# Patient Record
Sex: Male | Born: 1975 | Race: Black or African American | Hispanic: No | Marital: Married | Smoking: Never smoker
Health system: Southern US, Community
[De-identification: ages and names within clinical notes are randomized; demographics above are authoritative.]

## PROBLEM LIST (undated history)

## (undated) DIAGNOSIS — E7800 Pure hypercholesterolemia, unspecified: Secondary | ICD-10-CM

---

## 2007-03-11 ENCOUNTER — Emergency Department (HOSPITAL_COMMUNITY): Admission: EM | Admit: 2007-03-11 | Discharge: 2007-03-11 | Payer: Self-pay | Admitting: Emergency Medicine

## 2014-05-27 ENCOUNTER — Ambulatory Visit
Admission: RE | Admit: 2014-05-27 | Discharge: 2014-05-27 | Disposition: A | Discharge status: Home / Self Care | Payer: BC Managed Care – PPO | Source: Ambulatory Visit | Attending: Geriatric Medicine | Admitting: Geriatric Medicine

## 2014-05-27 ENCOUNTER — Other Ambulatory Visit: Payer: Self-pay | Admitting: Geriatric Medicine

## 2014-05-27 DIAGNOSIS — M546 Pain in thoracic spine: Secondary | ICD-10-CM

## 2015-11-29 IMAGING — CR DG THORACIC SPINE 3V
3 series · 3 of 3 positions shown · non-contrast
Comparison: None

CLINICAL DATA: Mid thoracic spine pain for 3 weeks, no injury

EXAM:
THORACIC SPINE - 2 VIEW + SWIMMERS

[t t-spine a.p.]
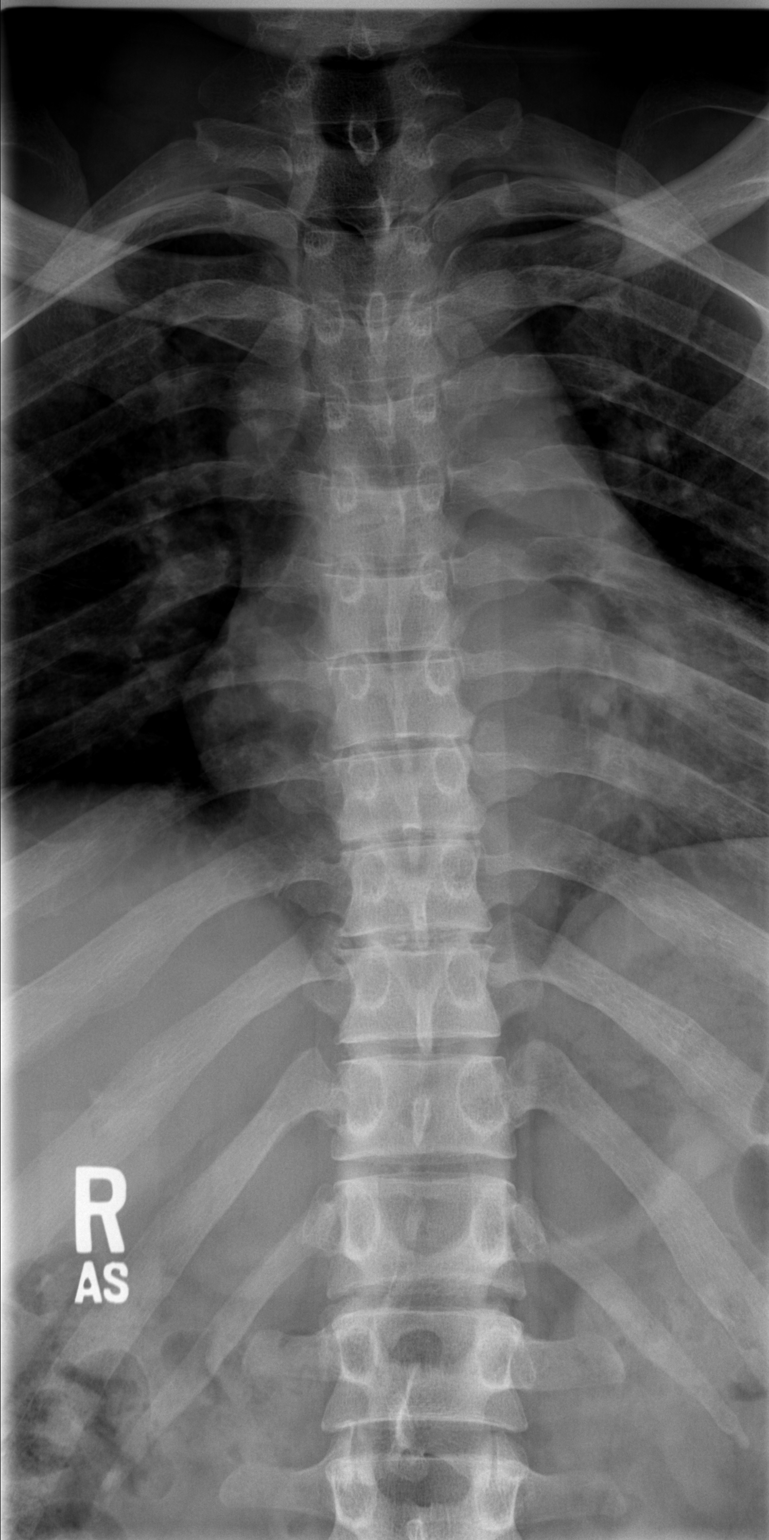

[t t-spine lat *]
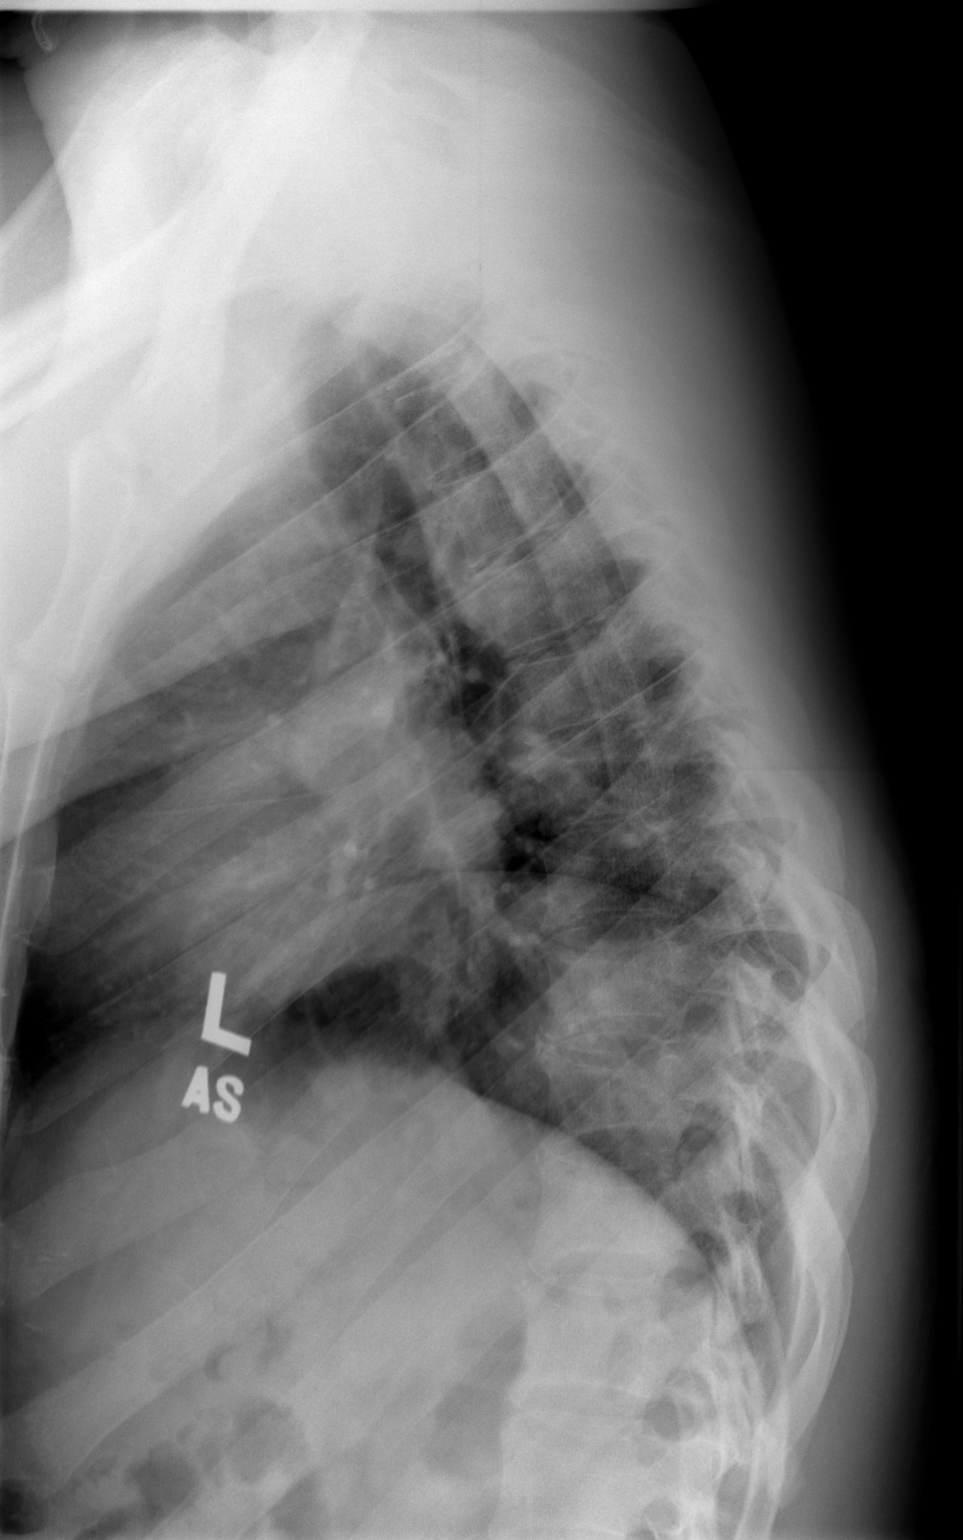

[t swimmers]
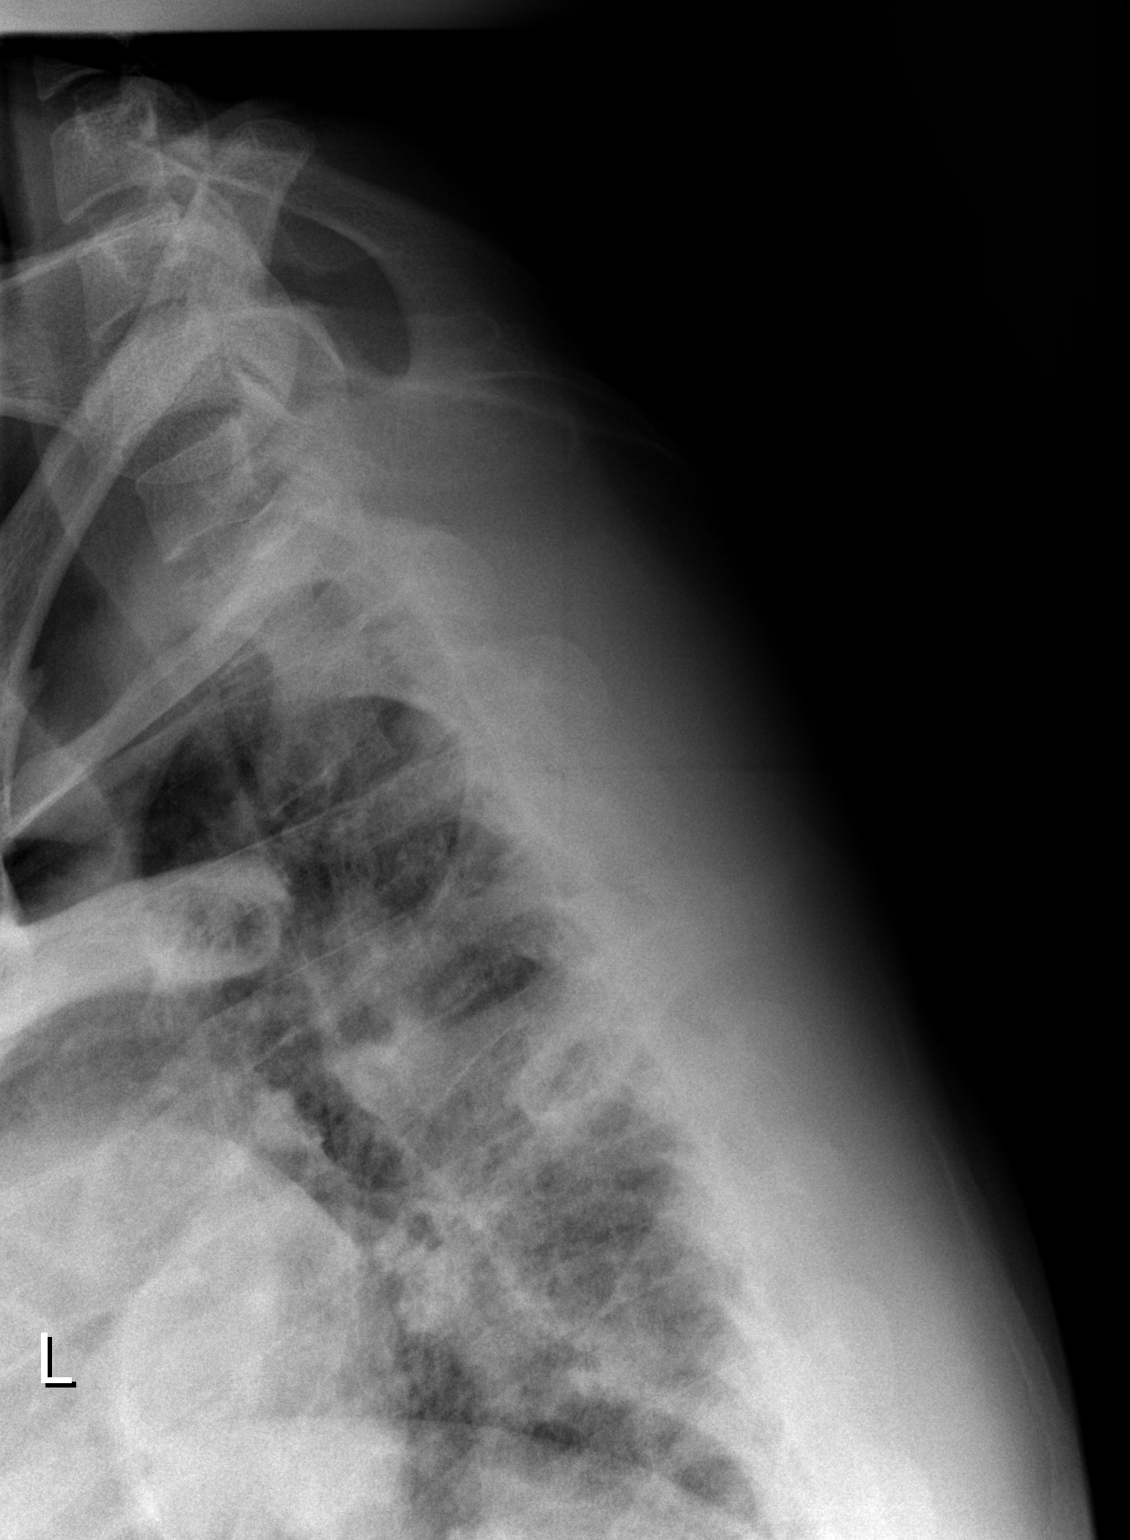

[3 of 3 positions shown; findings below may reference images not displayed]

FINDINGS: Twelve pairs of ribs.

Minimal levoconvex thoracolumbar scoliosis.

Osseous mineralization normal.

Vertebral body and disc space heights maintained.

No acute fracture, subluxation or bone destruction.

Visualized posterior ribs unremarkable.
IMPRESSION: No acute thoracic spine abnormalities.

## 2016-05-25 DIAGNOSIS — Z23 Encounter for immunization: Secondary | ICD-10-CM | POA: Diagnosis not present

## 2016-09-15 DIAGNOSIS — Z23 Encounter for immunization: Secondary | ICD-10-CM | POA: Diagnosis not present

## 2016-09-15 DIAGNOSIS — Z0000 Encounter for general adult medical examination without abnormal findings: Secondary | ICD-10-CM | POA: Diagnosis not present

## 2016-09-15 DIAGNOSIS — Z125 Encounter for screening for malignant neoplasm of prostate: Secondary | ICD-10-CM | POA: Diagnosis not present

## 2016-12-03 ENCOUNTER — Emergency Department
Admission: EM | Admit: 2016-12-03 | Discharge: 2016-12-03 | Disposition: A | Discharge status: Home / Self Care | Payer: BLUE CROSS/BLUE SHIELD | Source: Home / Self Care | Attending: Family Medicine | Admitting: Family Medicine

## 2016-12-03 ENCOUNTER — Encounter: Payer: Self-pay | Admitting: Emergency Medicine

## 2016-12-03 DIAGNOSIS — H6692 Otitis media, unspecified, left ear: Secondary | ICD-10-CM

## 2016-12-03 DIAGNOSIS — J069 Acute upper respiratory infection, unspecified: Secondary | ICD-10-CM

## 2016-12-03 HISTORY — DX: Pure hypercholesterolemia, unspecified: E78.00

## 2016-12-03 MED ORDER — AMOXICILLIN-POT CLAVULANATE 875-125 MG PO TABS: 1.0000 | ORAL_TABLET | Freq: Two times a day (BID) | ORAL | 0 refills | Status: DC

## 2016-12-03 MED ORDER — BENZONATATE 100 MG PO CAPS: 100.0000 mg | ORAL_CAPSULE | Freq: Three times a day (TID) | ORAL | 0 refills | Status: DC

## 2016-12-03 MED ORDER — FLUTICASONE PROPIONATE 50 MCG/ACT NA SUSP: 2.0000 | Freq: Every day | NASAL | 2 refills | Status: DC

## 2016-12-03 NOTE — ED Provider Notes (Signed)
CSN: [id number]     Arrival date & time 12/03/16  0913 History   First MD Initiated Contact with Patient 12/03/16 [phone]     Chief Complaint  Patient presents with  . Facial Pain   (Consider location/radiation/quality/duration/timing/severity/associated sxs/prior Treatment) HPI  Scott Clayton is a 41 y.o. male presenting to UC with c/o 2-3 days of sinus congestion with facial pain and pressure as well as Left ear pain that is worse at night, aching and throbbing.  Mild to moderately intermittent productive cough.  He took mucinex the first day but no OTC medications since then. Denies fever, chills, n/v/d. Denies known sick contacts.    Past Medical History:  Diagnosis Date  . Hypercholesteremia    History reviewed. No pertinent surgical history. Family History  Problem Relation Age of Onset  . Renal Disease Mother   . Hypertension Father    Social History  Substance Use Topics  . Smoking status: Never Smoker  . Smokeless tobacco: Never Used  . Alcohol use No    Review of Systems  Constitutional: Negative for chills and fever.  HENT: Positive for congestion, ear pain, postnasal drip, rhinorrhea, sinus pain, sinus pressure and sore throat. Negative for trouble swallowing and voice change.   Respiratory: Positive for cough. Negative for shortness of breath.   Cardiovascular: Negative for chest pain and palpitations.  Gastrointestinal: Negative for abdominal pain, diarrhea, nausea and vomiting.  Musculoskeletal: Negative for arthralgias, back pain and myalgias.  Skin: Negative for rash.  Neurological: Positive for headaches (frontal). Negative for dizziness and light-headedness.    Allergies  Sulfa antibiotics  Home Medications   Prior to Admission medications   Medication Sig Start Date End Date Taking? Authorizing Provider  amoxicillin-clavulanate (AUGMENTIN) 875-125 MG tablet Take 1 tablet by mouth 2 (two) times daily. One po bid x 7 days 12/03/16   Junius Finner'Malley, Tajae Rybicki, PA-C   benzonatate (TESSALON) 100 MG capsule Take 1-2 capsules (100-200 mg total) by mouth every 8 (eight) hours. 12/03/16   Junius Finner'Malley, Bostyn Bogie, PA-C  fluticasone (FLONASE) 50 MCG/ACT nasal spray Place 2 sprays into both nostrils daily. 12/03/16   Junius Finner'Malley, Gilma Bessette, PA-C   Meds Ordered and Administered this Visit  Medications - No data to display  BP 118/74 (BP Location: Left Arm)   Pulse (!) 48   Temp 98.6 F (37 C) (Oral)   Ht 5' 11.5" (1.816 m)   Wt 204 lb 12 oz (92.9 kg)   SpO2 97%   BMI 28.16 kg/m  No data found.   Physical Exam  Constitutional: He appears well-developed and well-nourished. No distress.  HENT:  Head: Normocephalic and atraumatic.  Right Ear: Tympanic membrane is erythematous.  Left Ear: Tympanic membrane is erythematous and bulging.  Nose: Nose normal.  Mouth/Throat: Uvula is midline, oropharynx is clear and moist and mucous membranes are normal.  Eyes: Conjunctivae are normal. No scleral icterus.  Neck: Normal range of motion. Neck supple.  Cardiovascular: Normal rate, regular rhythm and normal heart sounds.   Pulmonary/Chest: Effort normal and breath sounds normal. No stridor. No respiratory distress. He has no wheezes. He has no rales.  Abdominal: Soft. He exhibits no distension. There is no tenderness.  Musculoskeletal: Normal range of motion.  Lymphadenopathy:    He has no cervical adenopathy.  Neurological: He is alert.  Skin: Skin is warm and dry. He is not diaphoretic.  Nursing note and vitals reviewed.   Urgent Care Course     Procedures (including critical care time)  Labs Review  Labs Reviewed - No data to display  Imaging Review No results found.   MDM   1. Left acute otitis media   2. Upper respiratory tract infection, unspecified type    Pt c/o 2-3 days of URI symptoms. TMs erythematous with Left TM beginning to bulge.  Will treat for Left AOM Rx; Augmentin, Flonase and tessalon  f/u with PCP as needed.    Junius Finner,  PA-C 12/03/16 1022

## 2016-12-03 NOTE — ED Triage Notes (Signed)
Pt c/o possible sinus infection, sore throat x 2-3 days, drainage from eyes, left ear pain x 1 day.

## 2017-02-22 DIAGNOSIS — Z0000 Encounter for general adult medical examination without abnormal findings: Secondary | ICD-10-CM | POA: Diagnosis not present

## 2017-09-01 DIAGNOSIS — Z23 Encounter for immunization: Secondary | ICD-10-CM | POA: Diagnosis not present

## 2018-06-01 DIAGNOSIS — Z23 Encounter for immunization: Secondary | ICD-10-CM | POA: Diagnosis not present

## 2018-07-13 ENCOUNTER — Other Ambulatory Visit: Payer: Self-pay | Admitting: Internal Medicine

## 2018-07-13 ENCOUNTER — Ambulatory Visit
Admission: RE | Admit: 2018-07-13 | Discharge: 2018-07-13 | Disposition: A | Discharge status: Home / Self Care | Payer: BLUE CROSS/BLUE SHIELD | Source: Ambulatory Visit | Attending: Internal Medicine | Admitting: Internal Medicine

## 2018-07-13 DIAGNOSIS — R059 Cough, unspecified: Secondary | ICD-10-CM

## 2018-07-13 DIAGNOSIS — R05 Cough: Secondary | ICD-10-CM | POA: Diagnosis not present

## 2018-08-10 DIAGNOSIS — Z8701 Personal history of pneumonia (recurrent): Secondary | ICD-10-CM | POA: Diagnosis not present

## 2018-09-06 DIAGNOSIS — J302 Other seasonal allergic rhinitis: Secondary | ICD-10-CM | POA: Diagnosis not present

## 2018-09-06 DIAGNOSIS — Z13220 Encounter for screening for lipoid disorders: Secondary | ICD-10-CM | POA: Diagnosis not present

## 2018-09-06 DIAGNOSIS — Z8249 Family history of ischemic heart disease and other diseases of the circulatory system: Secondary | ICD-10-CM | POA: Diagnosis not present

## 2018-09-06 DIAGNOSIS — Z0000 Encounter for general adult medical examination without abnormal findings: Secondary | ICD-10-CM | POA: Diagnosis not present

## 2018-09-06 DIAGNOSIS — Z125 Encounter for screening for malignant neoplasm of prostate: Secondary | ICD-10-CM | POA: Diagnosis not present

## 2019-05-10 DIAGNOSIS — M722 Plantar fascial fibromatosis: Secondary | ICD-10-CM | POA: Diagnosis not present

## 2019-05-25 DIAGNOSIS — Z23 Encounter for immunization: Secondary | ICD-10-CM | POA: Diagnosis not present

## 2019-09-26 DIAGNOSIS — Z0000 Encounter for general adult medical examination without abnormal findings: Secondary | ICD-10-CM | POA: Diagnosis not present

## 2019-09-26 DIAGNOSIS — Z125 Encounter for screening for malignant neoplasm of prostate: Secondary | ICD-10-CM | POA: Diagnosis not present

## 2019-09-26 DIAGNOSIS — E7800 Pure hypercholesterolemia, unspecified: Secondary | ICD-10-CM | POA: Diagnosis not present

## 2020-01-11 DIAGNOSIS — F419 Anxiety disorder, unspecified: Secondary | ICD-10-CM | POA: Diagnosis not present

## 2020-01-15 IMAGING — DX DG CHEST 2V
2 series · 2 of 2 positions shown · non-contrast
Comparison: Chest x-ray 05/27/2014.

CLINICAL DATA: Dry cough.  Shortness of breath.

EXAM:
CHEST - 2 VIEW

[dg chest 2 view (1 of 2)]
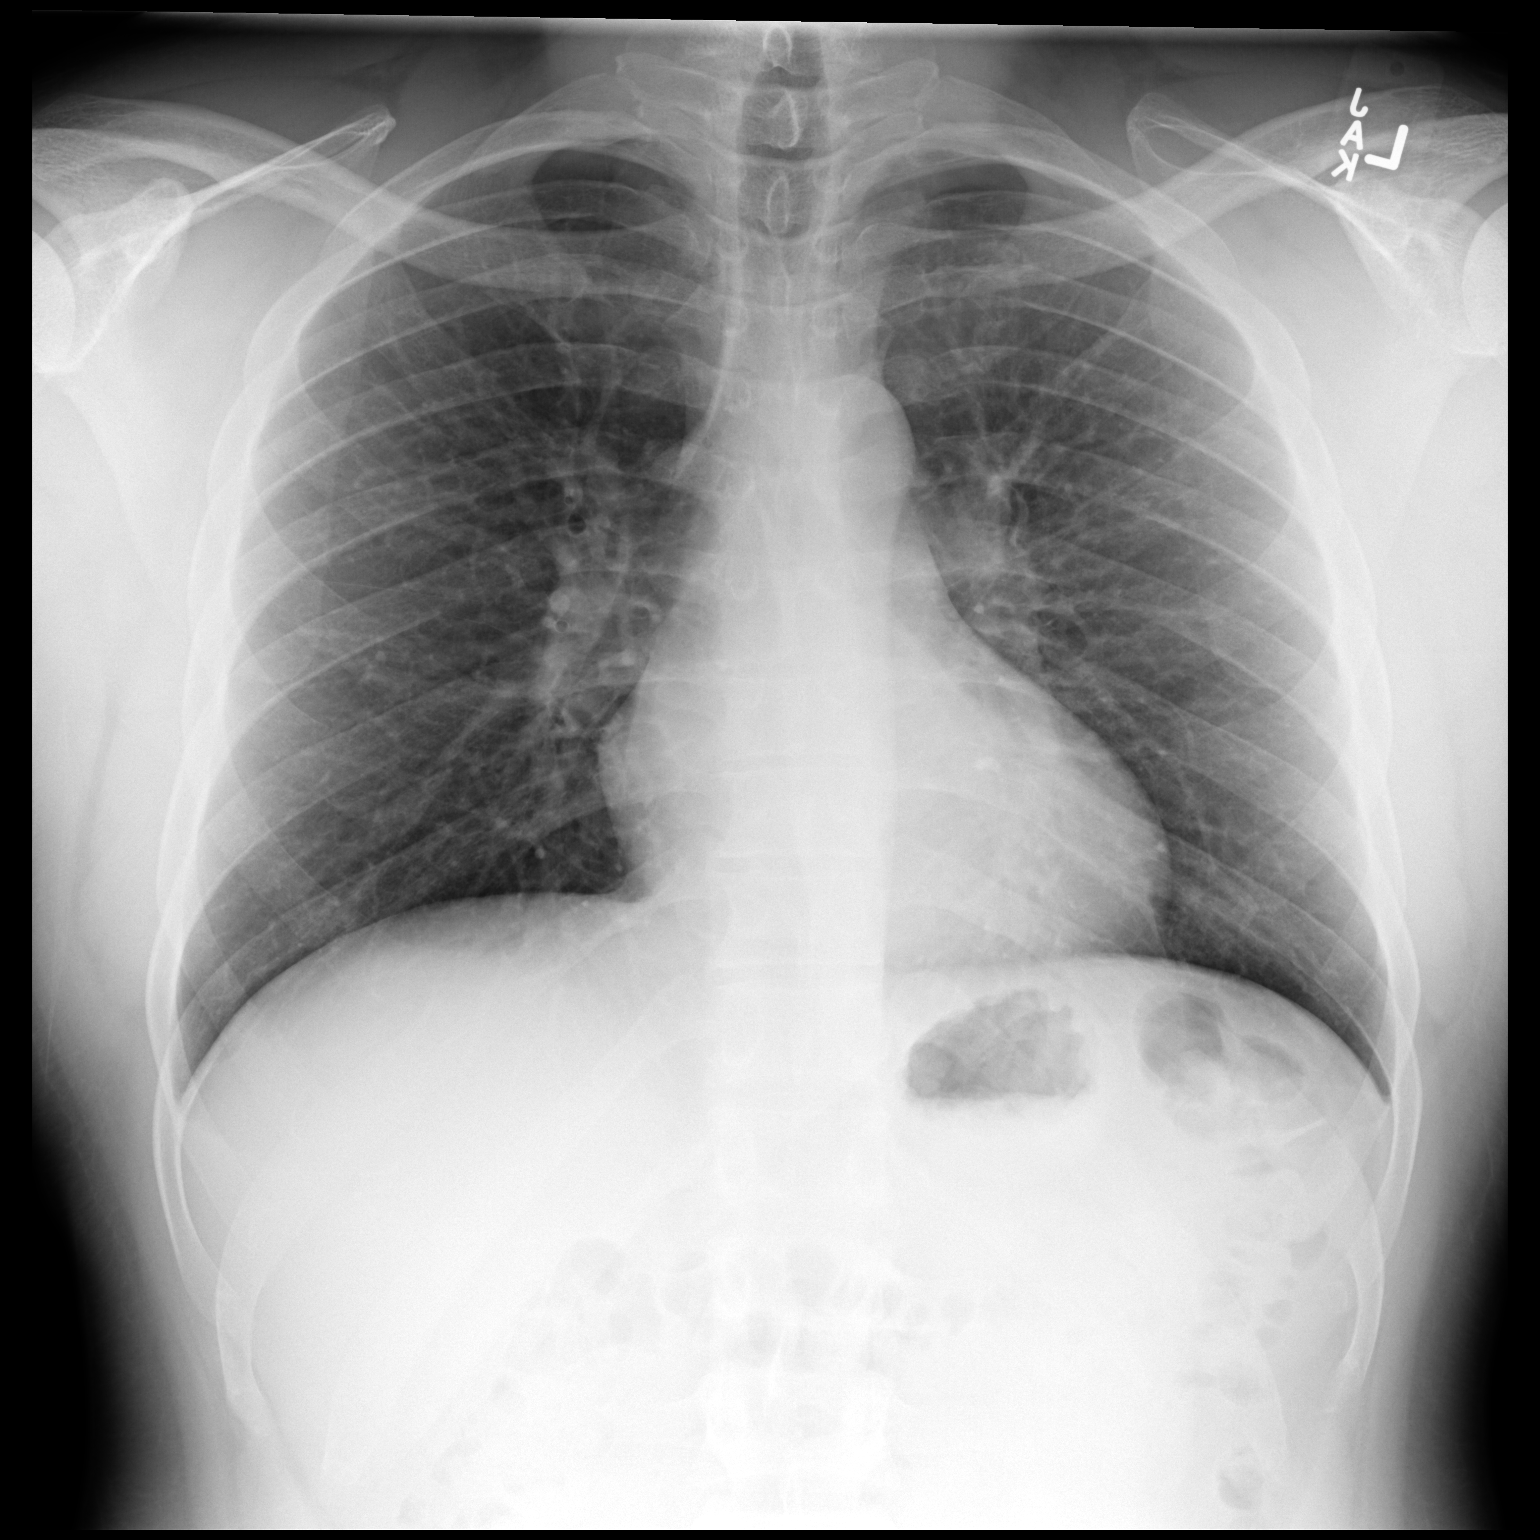

[dg chest 2 view (2 of 2)]
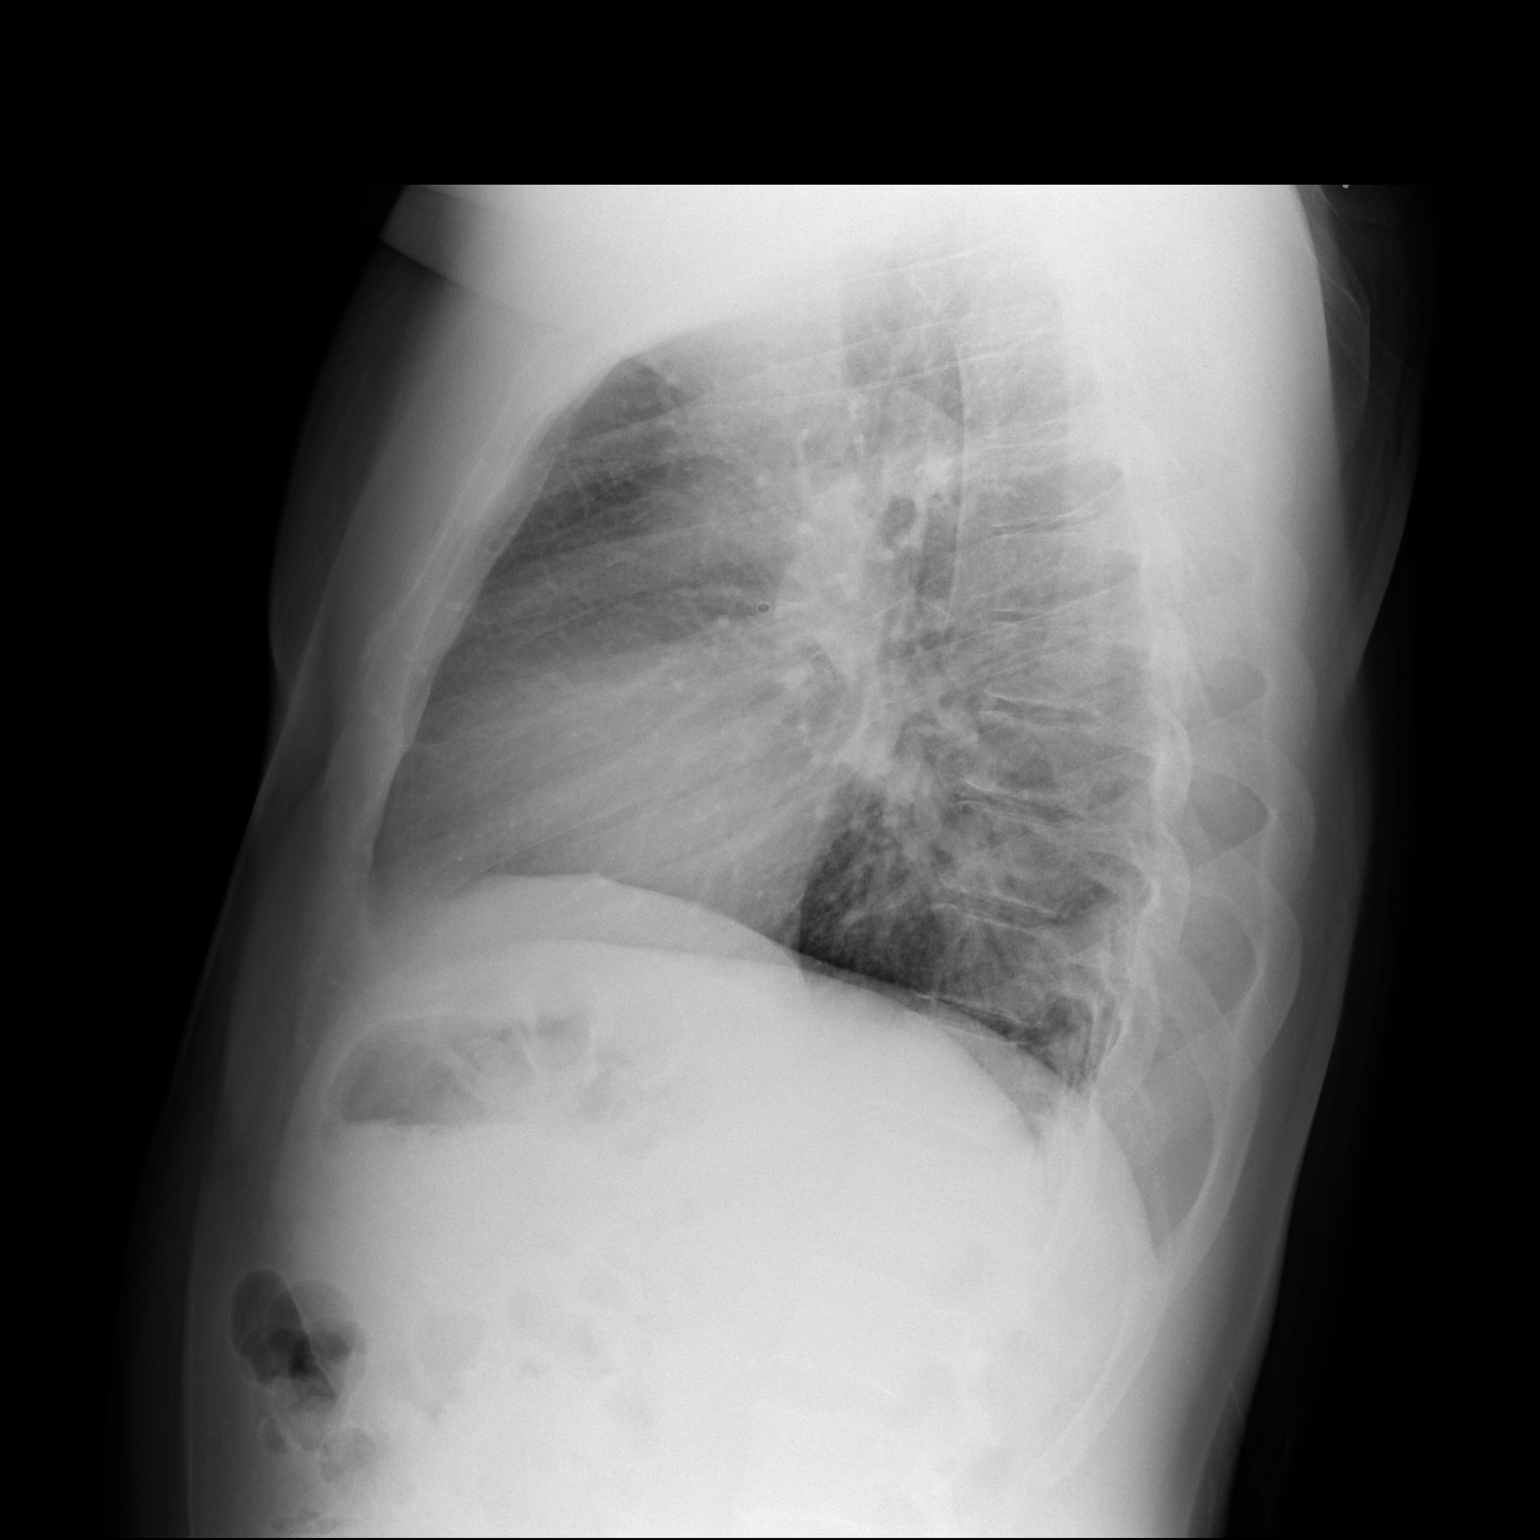

[2 of 2 positions shown; findings below may reference images not displayed]

FINDINGS: Mediastinum and hilar structures normal. Heart size normal. Mild
left base infiltrate. Pleural effusion or pneumothorax. No acute
bony abnormality.
IMPRESSION: Mild left base infiltrate suggesting pneumonia.

## 2020-07-07 ENCOUNTER — Ambulatory Visit: Payer: Self-pay | Attending: Internal Medicine

## 2020-07-07 DIAGNOSIS — Z23 Encounter for immunization: Secondary | ICD-10-CM

## 2020-07-07 NOTE — Progress Notes (Signed)
   Covid-19 Vaccination Clinic  Name:  Scott Clayton    MRN: [id number] DOB: 09-Dec-1975  07/07/2020  Mr. Scott Clayton was observed post Covid-19 immunization for 15 minutes without incident. He was provided with Vaccine Information Sheet and instruction to access the V-Safe system.   Mr. Scott Clayton was instructed to call 911 with any severe reactions post vaccine: Marland Kitchen Difficulty breathing  . Swelling of face and throat  . A fast heartbeat  . A bad rash all over body  . Dizziness and weakness   Immunizations Administered    Name Date Dose VIS Date Route   Pfizer COVID-19 Vaccine 07/07/2020  4:40 PM 0.3 mL 05/20/2020 Intramuscular   Manufacturer: [employer], [employer]   Lot: [id number]   NDC: [address]-5

## 2021-04-29 DIAGNOSIS — Z13220 Encounter for screening for lipoid disorders: Secondary | ICD-10-CM | POA: Diagnosis not present

## 2021-04-29 DIAGNOSIS — Z0000 Encounter for general adult medical examination without abnormal findings: Secondary | ICD-10-CM | POA: Diagnosis not present

## 2021-04-29 DIAGNOSIS — Z23 Encounter for immunization: Secondary | ICD-10-CM | POA: Diagnosis not present

## 2021-04-29 DIAGNOSIS — E7800 Pure hypercholesterolemia, unspecified: Secondary | ICD-10-CM | POA: Diagnosis not present

## 2021-11-12 DIAGNOSIS — K649 Unspecified hemorrhoids: Secondary | ICD-10-CM | POA: Diagnosis not present

## 2021-11-12 DIAGNOSIS — Z1211 Encounter for screening for malignant neoplasm of colon: Secondary | ICD-10-CM | POA: Diagnosis not present

## 2022-05-09 DIAGNOSIS — R059 Cough, unspecified: Secondary | ICD-10-CM | POA: Diagnosis not present

## 2022-05-09 DIAGNOSIS — Z03818 Encounter for observation for suspected exposure to other biological agents ruled out: Secondary | ICD-10-CM | POA: Diagnosis not present

## 2022-06-22 DIAGNOSIS — Z23 Encounter for immunization: Secondary | ICD-10-CM | POA: Diagnosis not present

## 2022-06-22 DIAGNOSIS — Z0000 Encounter for general adult medical examination without abnormal findings: Secondary | ICD-10-CM | POA: Diagnosis not present

## 2022-06-22 DIAGNOSIS — E7800 Pure hypercholesterolemia, unspecified: Secondary | ICD-10-CM | POA: Diagnosis not present

## 2023-05-31 ENCOUNTER — Encounter: Payer: Self-pay | Admitting: Nurse Practitioner

## 2023-05-31 ENCOUNTER — Ambulatory Visit: Payer: No Typology Code available for payment source | Admitting: Nurse Practitioner

## 2023-05-31 VITALS — BP 120/70 | HR 67 | Temp 98.2°F | Ht 72.0 in | Wt 215.6 lb

## 2023-05-31 DIAGNOSIS — F419 Anxiety disorder, unspecified: Secondary | ICD-10-CM | POA: Diagnosis not present

## 2023-05-31 DIAGNOSIS — E7800 Pure hypercholesterolemia, unspecified: Secondary | ICD-10-CM

## 2023-05-31 DIAGNOSIS — Z79899 Other long term (current) drug therapy: Secondary | ICD-10-CM

## 2023-05-31 DIAGNOSIS — Z7689 Persons encountering health services in other specified circumstances: Secondary | ICD-10-CM | POA: Insufficient documentation

## 2023-05-31 DIAGNOSIS — Z1159 Encounter for screening for other viral diseases: Secondary | ICD-10-CM

## 2023-05-31 DIAGNOSIS — F32A Depression, unspecified: Secondary | ICD-10-CM

## 2023-05-31 DIAGNOSIS — R41840 Attention and concentration deficit: Secondary | ICD-10-CM | POA: Diagnosis not present

## 2023-05-31 DIAGNOSIS — Z23 Encounter for immunization: Secondary | ICD-10-CM | POA: Diagnosis not present

## 2023-05-31 DIAGNOSIS — Z0000 Encounter for general adult medical examination without abnormal findings: Secondary | ICD-10-CM | POA: Diagnosis not present

## 2023-05-31 DIAGNOSIS — Z6829 Body mass index (BMI) 29.0-29.9, adult: Secondary | ICD-10-CM

## 2023-05-31 DIAGNOSIS — E663 Overweight: Secondary | ICD-10-CM

## 2023-05-31 DIAGNOSIS — Z114 Encounter for screening for human immunodeficiency virus [HIV]: Secondary | ICD-10-CM

## 2023-05-31 DIAGNOSIS — Z13228 Encounter for screening for other metabolic disorders: Secondary | ICD-10-CM

## 2023-05-31 DIAGNOSIS — Z125 Encounter for screening for malignant neoplasm of prostate: Secondary | ICD-10-CM

## 2023-05-31 MED ORDER — ROSUVASTATIN CALCIUM 5 MG PO TABS: 5.0000 mg | ORAL_TABLET | Freq: Every day | ORAL | 2 refills | Status: DC

## 2023-05-31 MED ORDER — ROSUVASTATIN CALCIUM 5 MG PO TABS: 5.0000 mg | ORAL_TABLET | Freq: Every day | ORAL | 1 refills | Status: AC

## 2023-05-31 NOTE — Patient Instructions (Addendum)
Health Maintenance  Topic Date Due   Hepatitis C Screening  Never done   DTaP/Tdap/Td vaccine (1 - Tdap) Never done   Colon Cancer Screening  Never done   COVID-19 Vaccine (2 - Pfizer risk series) 06/16/2023*   Flu Shot  Completed   HIV Screening  Completed   HPV Vaccine  Aged Out  *Topic was postponed. The date shown is not the original due date.

## 2023-05-31 NOTE — Assessment & Plan Note (Signed)
Will check Hepatitis C screening due to recent recommendations to screen all adults 18 years and older

## 2023-05-31 NOTE — Assessment & Plan Note (Signed)
Behavior modifications discussed and diet history reviewed.   Pt will continue to exercise regularly and modify diet with low GI, plant based foods and decrease intake of processed foods.  Recommend intake of daily multivitamin, Vitamin D, and calcium.  Recommend colonoscopy (will get copy of his colonoscopy) for preventive screenings, as well as recommend immunizations that include influenza, TDAP

## 2023-05-31 NOTE — Assessment & Plan Note (Signed)
Depression screen score is 10 as well as his anxiety screen score.  Currently in counseling.  Will be also make a referral to psychiatrist to be evaluated for possible ADD.

## 2023-05-31 NOTE — Assessment & Plan Note (Signed)
Patient is here to establish care. Went over patient medical, family, social and surgical history. Reviewed with patient their medications and any allergies  Reviewed with patient their sexual orientation, drug/tobacco and alcohol use Dicussed any new concerns with patient  HM done today  Educated patient about the importance of annual screenings and immunizations.  Advised patient to eat a healthy diet along with exercise for atleast 30-45 min atleast 4-5 days of the week.

## 2023-05-31 NOTE — Assessment & Plan Note (Signed)
Influenza vaccine administered Encouraged to take Tylenol as needed for fever or muscle aches.

## 2023-05-31 NOTE — Progress Notes (Signed)
Madelaine Bhat, CMA,acting as a Neurosurgeon for Arnette Felts, FNP.,have documented all relevant documentation on the behalf of Arnette Felts, FNP,as directed by  Arnette Felts, FNP while in the presence of Arnette Felts, FNP.  Subjective:   Patient ID: Scott Clayton , male    DOB: Jul 08, 1976 , 47 y.o.   MRN: [id number]  No chief complaint on file.   HPI  Patient presents today to establish care, he referred a family member. He was seeing Dr. Nehemiah Settle about one year ago and wanted a change. Married - has 5 children (22 Daughter Destiny, 53 Daughter Rameses, Ou 35 y/o McKayla 7 y/o daughter, Ephriam Knuckles 51 y/o son) all are healthy. with one grandchild - granddaughter (1 y/o - healthy). 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 He is seeing a counseling in [place] [employer]) and his has completed forms [place] attention [facility].   PMH - hypercholesteremia since childhood.   [facility] - father - stents placed to bilateral lower extremities and has an amputation.  Mother - breast cancer (newly diagnosed) He is an only child  Patient would also like to have his physical today. He has not taken his rosuvastatin in several months. He has fasted today. Patient denies any chest pain, SOB, or headaches. Patient has no concerns today. Patient hasn't been seen by PCP in about a year.  BP Readings from Last 3 Encounters: 05/31/23 : 120/70 12/03/16 : 118/74       Past Medical History:  Diagnosis Date   Hypercholesteremia      Family History  Problem Relation Age of Onset   Cancer Mother    Renal Disease Mother    Diabetes Father    COPD Father    Hypertension Father      Current Outpatient Medications:    rosuvastatin (CRESTOR) 5 MG tablet, Take 1 tablet (5 mg total) by mouth daily., Disp: 90 tablet, Rfl: 1   Allergies  Allergen Reactions   Sulfa Antibiotics      Men's preventive visit. Patient Health Questionnaire (PHQ-2) is  Flowsheet Row Office Visit from 05/31/2023 in [facility]  PHQ-2 Total Score 4     Patient is on a regular diet; he does try to avoid processed foods unless it is busy. Exercise - none - admits needing to restart. Marital status: Married. Relevant history for alcohol use is:  Social History   Substance and Sexual Activity  Alcohol Use No   Relevant history for tobacco use is:  Social History   Tobacco Use  Smoking Status Never  Smokeless Tobacco Never  .   Review of Systems  Constitutional: Negative.   HENT: Negative.    Eyes: Negative.   Respiratory: Negative.    Cardiovascular: Negative.   Gastrointestinal: Negative.   Endocrine: Negative.   Genitourinary: Negative.   Musculoskeletal: Negative.   Skin: Negative.   Allergic/Immunologic: Negative.   Hematological: Negative.   Psychiatric/Behavioral: Negative.       Today's Vitals   05/31/23 1039  BP: 120/70  Pulse: 67  Temp: 98.2 F (36.8 C)  TempSrc: Oral  Weight: 215 lb 9.6 oz (97.8 kg)  Height: 6' (1.829 m)  PainSc: 0-No pain   Body mass index is 29.24 kg/m.  Wt Readings from Last 3 Encounters:  05/31/23 215 lb 9.6 oz (97.8 kg)  12/03/16 204 lb 12 oz (92.9 kg)      05/31/2023   10:48 AM  GAD 7 : Generalized Anxiety Score  Nervous, Anxious, on Edge 2  Control/stop worrying 1  Worry too much - different things 1  Trouble relaxing 1  Restless 2  Easily annoyed or irritable 2  Afraid - awful might happen 1  Total GAD 7 Score 10  Anxiety Difficulty Somewhat difficult       05/31/2023   10:48 AM  Depression screen PHQ 2/9  Decreased Interest 2  Down, Depressed, Hopeless 2  PHQ - 2 Score 4  Altered sleeping 0  Tired, decreased energy 0  Change in appetite 2  Feeling bad or failure about yourself  1  Trouble concentrating 2  Moving slowly or fidgety/restless 1  Suicidal thoughts 0  PHQ-9 Score 10  Difficult doing work/chores Somewhat difficult    Objective:  Physical Exam Vitals reviewed.  Constitutional:      General: He  is not in acute distress.    Appearance: Normal appearance.  HENT:     Head: Normocephalic and atraumatic.     Right Ear: Tympanic membrane, ear canal and external ear normal. There is no impacted cerumen.     Left Ear: Tympanic membrane, ear canal and external ear normal. There is no impacted cerumen.     Nose: Nose normal.     Mouth/Throat:     Mouth: Mucous membranes are moist.  Eyes:     Pupils: Pupils are equal, round, and reactive to light.  Cardiovascular:     Rate and Rhythm: Normal rate and regular rhythm.     Pulses: Normal pulses.     Heart sounds: Normal heart sounds. No murmur heard. Pulmonary:     Effort: Pulmonary effort is normal. No respiratory distress.     Breath sounds: Normal breath sounds. No wheezing.  Abdominal:     General: Abdomen is flat. Bowel sounds are normal. There is no distension.     Palpations: Abdomen is soft.     Tenderness: There is no abdominal tenderness.  Genitourinary:    Prostate: Normal.     Rectum: Guaiac result negative.  Musculoskeletal:        General: Normal range of motion.     Cervical back: Normal range of motion and neck supple.  Skin:    General: Skin is warm and dry.     Capillary Refill: Capillary refill takes less than 2 seconds.  Neurological:     General: No focal deficit present.     Mental Status: He is alert and oriented to person, place, and time.     Cranial Nerves: No cranial nerve deficit.     Motor: No weakness.  Psychiatric:        Mood and Affect: Mood normal.        Behavior: Behavior normal.        Thought Content: Thought content normal.        Judgment: Judgment normal.         Assessment And Plan:    Establishing care with new doctor, encounter for Assessment & Plan: Patient is here to establish care. Went over patient medical, family, social and surgical history. Reviewed with patient their medications and any allergies  Reviewed with patient their sexual orientation, drug/tobacco and  alcohol use Dicussed any new concerns with patient  HM done today Educated patient about the importance of annual screenings and immunizations.  Advised patient to eat a healthy diet along with exercise for atleast 30-45 min atleast 4-5 days of the week.     Encounter for annual health examination Assessment & Plan: Behavior modifications discussed and diet  history reviewed.   Pt will continue to exercise regularly and modify diet with low GI, plant based foods and decrease intake of processed foods.  Recommend intake of daily multivitamin, Vitamin D, and calcium.  Recommend colonoscopy (will get copy of his colonoscopy) for preventive screenings, as well as recommend immunizations that include influenza, TDAP   Need for Tdap vaccination Assessment & Plan: Will give tetanus vaccine today while in office. Refer to order management. TDAP will be administered to adults 32-73 years old every 10 years.   Orders: -     Tdap vaccine greater than or equal to 7yo IM  Need for influenza vaccination Assessment & Plan: Influenza vaccine administered Encouraged to take Tylenol as needed for fever or muscle aches.   Orders: -     Flu vaccine trivalent PF, 6mos and older(Flulaval,Afluria,Fluarix,Fluzone)  Encounter for screening for metabolic disorder -     Hemoglobin A1c  Encounter for prostate cancer screening -     PSA  Encounter for HIV (human immunodeficiency virus) test -     HIV Antibody (routine testing w rflx)  Overweight with body mass index (BMI) of 29 to 29.9 in adult  Encounter for hepatitis C screening test for low risk patient Assessment & Plan: Will check Hepatitis C screening due to recent recommendations to screen all adults 18 years and older     Orders: -     Hepatitis C antibody  Hypercholesteremia Assessment & Plan: Has been on cholesterol medications for several years.  But has not taking for the last few months.  Will check lipid panel today reports he  is fasting.  Continue low-fat diet.  Orders: -     Lipid panel -     CMP14+EGFR -     Rosuvastatin Calcium; Take 1 tablet (5 mg total) by mouth daily.  Dispense: 90 tablet; Refill: 1  Concentration deficit Assessment & Plan: He is already established with a counselor with telehealth.  He would like to see a provider to testing for possible ADD.  Referral has been placed  Orders: -     Ambulatory referral to Psychiatry  Anxiety and depression Assessment & Plan: Depression screen score is 10 as well as his anxiety screen score.  Currently in counseling.  Will be also make a referral to psychiatrist to be evaluated for possible ADD.   Other long term (current) drug therapy -     CBC with Differential/Platelet     Return for 4 month chol check, 1 year physical. Patient was given opportunity to ask questions. Patient verbalized understanding of the plan and was able to repeat key elements of the plan. All questions were answered to their satisfaction.   Arnette Felts, FNP  I, Arnette Felts, FNP, have reviewed all documentation for this visit. The documentation on 05/31/23 for the exam, diagnosis, procedures, and orders are all accurate and complete.

## 2023-05-31 NOTE — Assessment & Plan Note (Signed)
Has been on cholesterol medications for several years.  But has not taking for the last few months.  Will check lipid panel today reports he is fasting.  Continue low-fat diet.

## 2023-05-31 NOTE — Assessment & Plan Note (Signed)
He is already established with a counselor with telehealth.  He would like to see a provider to testing for possible ADD.  Referral has been placed

## 2023-06-01 LAB — CMP14+EGFR
ALT: 39 [IU]/L (ref 0–44)
AST: 23 [IU]/L (ref 0–40)
Albumin: 4.5 g/dL (ref 4.1–5.1)
Alkaline Phosphatase: 84 [IU]/L (ref 44–121)
BUN/Creatinine Ratio: 18 (ref 9–20)
BUN: 17 mg/dL (ref 6–24)
Bilirubin Total: 0.3 mg/dL (ref 0.0–1.2)
CO2: 24 mmol/L (ref 20–29)
Calcium: 9.6 mg/dL (ref 8.7–10.2)
Chloride: 101 mmol/L (ref 96–106)
Creatinine, Ser: 0.97 mg/dL (ref 0.76–1.27)
Globulin, Total: 2.5 g/dL (ref 1.5–4.5)
Glucose: 91 mg/dL (ref 70–99)
Potassium: 4 mmol/L (ref 3.5–5.2)
Sodium: 139 mmol/L (ref 134–144)
Total Protein: 7 g/dL (ref 6.0–8.5)
eGFR: 97 mL/min/{1.73_m2} (ref >59)

## 2023-06-01 LAB — LIPID PANEL
Chol/HDL Ratio: 6.1 ratio — ABNORMAL HIGH (ref 0.0–5.0)
Cholesterol, Total: 239 mg/dL — ABNORMAL HIGH (ref 100–199)
HDL: 39 mg/dL — ABNORMAL LOW (ref >39)
LDL Chol Calc (NIH): 174 mg/dL — ABNORMAL HIGH (ref 0–99)
Triglycerides: 144 mg/dL (ref 0–149)
VLDL Cholesterol Cal: 26 mg/dL (ref 5–40)

## 2023-06-01 LAB — CBC WITH DIFFERENTIAL/PLATELET
Basophils Absolute: 0 10*3/uL (ref 0.0–0.2)
Basos: 1 %
EOS (ABSOLUTE): 0.1 10*3/uL (ref 0.0–0.4)
Eos: 1 %
Hematocrit: 48.4 % (ref 37.5–51.0)
Hemoglobin: 15.9 g/dL (ref 13.0–17.7)
Immature Grans (Abs): 0 10*3/uL (ref 0.0–0.1)
Immature Granulocytes: 0 %
Lymphocytes Absolute: 1 10*3/uL (ref 0.7–3.1)
Lymphs: 16 %
MCH: 28.4 pg (ref 26.6–33.0)
MCHC: 32.9 g/dL (ref 31.5–35.7)
MCV: 87 fL (ref 79–97)
Monocytes Absolute: 0.5 10*3/uL (ref 0.1–0.9)
Monocytes: 8 %
Neutrophils Absolute: 4.4 10*3/uL (ref 1.4–7.0)
Neutrophils: 74 %
Platelets: 253 10*3/uL (ref 150–450)
RBC: 5.59 x10E6/uL (ref 4.14–5.80)
RDW: 13.4 % (ref 11.6–15.4)
WBC: 6 10*3/uL (ref 3.4–10.8)

## 2023-06-01 LAB — HIV ANTIBODY (ROUTINE TESTING W REFLEX): HIV Screen 4th Generation wRfx: NONREACTIVE

## 2023-06-01 LAB — PSA: Prostate Specific Ag, Serum: 0.5 ng/mL (ref 0.0–4.0)

## 2023-06-01 LAB — HEMOGLOBIN A1C
Est. average glucose Bld gHb Est-mCnc: 117 mg/dL
Hgb A1c MFr Bld: 5.7 % — ABNORMAL HIGH (ref 4.8–5.6)

## 2023-06-01 LAB — HEPATITIS C ANTIBODY: Hep C Virus Ab: NONREACTIVE

## 2023-06-04 NOTE — Progress Notes (Signed)
I don't know what bio powder is?

## 2023-06-06 NOTE — Assessment & Plan Note (Signed)
Will give tetanus vaccine today while in office. Refer to order management. TDAP will be administered to adults 79-47 years old every 10 years.

## 2023-06-08 ENCOUNTER — Encounter: Payer: Self-pay | Admitting: Nurse Practitioner

## 2023-06-20 ENCOUNTER — Encounter: Payer: Self-pay | Admitting: Nurse Practitioner

## 2023-10-03 ENCOUNTER — Ambulatory Visit (INDEPENDENT_AMBULATORY_CARE_PROVIDER_SITE_OTHER): Payer: No Typology Code available for payment source | Admitting: Nurse Practitioner

## 2023-10-03 ENCOUNTER — Encounter: Payer: Self-pay | Admitting: Nurse Practitioner

## 2023-10-03 VITALS — BP 120/70 | HR 65 | Temp 98.2°F | Ht 72.0 in | Wt 222.0 lb

## 2023-10-03 DIAGNOSIS — E66811 Obesity, class 1: Secondary | ICD-10-CM | POA: Diagnosis not present

## 2023-10-03 DIAGNOSIS — Z23 Encounter for immunization: Secondary | ICD-10-CM

## 2023-10-03 DIAGNOSIS — E7800 Pure hypercholesterolemia, unspecified: Secondary | ICD-10-CM

## 2023-10-03 DIAGNOSIS — Z6830 Body mass index (BMI) 30.0-30.9, adult: Secondary | ICD-10-CM

## 2023-10-03 DIAGNOSIS — E6609 Other obesity due to excess calories: Secondary | ICD-10-CM | POA: Diagnosis not present

## 2023-10-03 DIAGNOSIS — R7303 Prediabetes: Secondary | ICD-10-CM | POA: Diagnosis not present

## 2023-10-03 NOTE — Patient Instructions (Signed)

## 2023-10-03 NOTE — Progress Notes (Signed)
 I,Jameka J Llittleton, CMA,acting as a Neurosurgeon for [employer], FNP.,have documented all relevant documentation on the behalf of Arnette Felts, FNP,as directed by  Arnette Felts, FNP while in the presence of Arnette Felts, FNP.  Subjective:  Patient ID: Scott Clayton , male    DOB: [birth year] , 48 y.o.   MRN: [id number]  Chief Complaint  Patient presents with   Hyperlipidemia    HPI  Patient presents today for a chol check. Patient reports compliance with his meds. Patient reports he does feel cramps in his legs sometimes. Patient wanted to know if he soul take coQ10.  Hyperlipidemia This is a chronic problem. The current episode started more than 1 year ago. The problem is uncontrolled. Recent lipid tests were reviewed and are high. He has no history of chronic renal disease. Current antihyperlipidemic treatment includes diet change and statins.     Past Medical History:  Diagnosis Date   Hypercholesteremia      Family History  Problem Relation Age of Onset   Cancer Mother    Renal Disease Mother    Diabetes Father    COPD Father    Hypertension Father      Current Outpatient Medications:    rosuvastatin (CRESTOR) 5 MG tablet, Take 1 tablet (5 mg total) by mouth daily., Disp: 90 tablet, Rfl: 1   Allergies  Allergen Reactions   Sulfa Antibiotics      Review of Systems  Constitutional: Negative.   Respiratory: Negative.    Cardiovascular: Negative.   Musculoskeletal: Negative.   Skin: Negative.   Neurological: Negative.   Psychiatric/Behavioral: Negative.       Today's Vitals   10/03/23 1450  BP: 120/70  Pulse: 65  Temp: 98.2 F (36.8 C)  TempSrc: Oral  Weight: 222 lb (100.7 kg)  Height: 6' (1.829 m)  PainSc: 0-No pain   Body mass index is 30.11 kg/m.  Wt Readings from Last 3 Encounters:  10/03/23 222 lb (100.7 kg)  05/31/23 215 lb 9.6 oz (97.8 kg)  12/03/16 204 lb 12 oz (92.9 kg)     Objective:  Physical Exam Vitals reviewed.  Constitutional:       General: He is not in acute distress.    Appearance: Normal appearance. He is obese.  HENT:     Head: Normocephalic.  Cardiovascular:     Rate and Rhythm: Normal rate and regular rhythm.     Pulses: Normal pulses.     Heart sounds: Normal heart sounds. No murmur heard. Pulmonary:     Effort: Pulmonary effort is normal. No respiratory distress.     Breath sounds: Normal breath sounds. No wheezing.  Musculoskeletal:        General: Normal range of motion.  Skin:    General: Skin is warm and dry.     Capillary Refill: Capillary refill takes less than 2 seconds.  Neurological:     General: No focal deficit present.     Mental Status: He is alert and oriented to person, place, and time.  Psychiatric:        Mood and Affect: Mood normal.        Behavior: Behavior normal.        Thought Content: Thought content normal.        Judgment: Judgment normal.         Assessment And Plan:  Hypercholesteremia Assessment & Plan: Cholesterol levels are elevated.  Continue current medications.  Will check lipid panel.   Orders: -  Lipid panel -     CMP14+EGFR  Class 1 obesity due to excess calories without serious comorbidity with body mass index (BMI) of 30.0 to 30.9 in adult Assessment & Plan: He is encouraged to strive for BMI less than 30 to decrease cardiac risk. Advised to aim for at least 150 minutes of exercise per week.    Prediabetes Assessment & Plan: Stable, continue healthy lifestyle modifications.   Orders: -     Hemoglobin A1c  Need for COVID-19 vaccine Assessment & Plan: Covid 19 vaccine given in office observed for 15 minutes without any adverse reaction   Orders: -     PFIZER Comirnaty(GRAY TOP)COVID-19 Vaccine    Return in about 6 months (around 04/04/2024) for chol check.  Patient was given opportunity to ask questions. Patient verbalized understanding of the plan and was able to repeat key elements of the plan. All questions were answered to their  satisfaction.    Jeanell Sparrow, FNP, have reviewed all documentation for this visit. The documentation on 10/03/23 for the exam, diagnosis, procedures, and orders are all accurate and complete.   IF YOU HAVE BEEN REFERRED TO A SPECIALIST, IT MAY TAKE 1-2 WEEKS TO SCHEDULE/PROCESS THE REFERRAL. IF YOU HAVE NOT HEARD FROM US/SPECIALIST IN TWO WEEKS, PLEASE GIVE [place] A CALL AT [phone].

## 2023-10-04 LAB — CMP14+EGFR
ALT: 26 IU/L (ref 0–44)
AST: 17 IU/L (ref 0–40)
Albumin: 4.5 g/dL (ref 4.1–5.1)
Alkaline Phosphatase: 76 IU/L (ref 44–121)
BUN/Creatinine Ratio: 18 (ref 9–20)
BUN: 17 mg/dL (ref 6–24)
Bilirubin Total: 0.5 mg/dL (ref 0.0–1.2)
CO2: 22 mmol/L (ref 20–29)
Calcium: 9.3 mg/dL (ref 8.7–10.2)
Chloride: 103 mmol/L (ref 96–106)
Creatinine, Ser: 0.96 mg/dL (ref 0.76–1.27)
Globulin, Total: 2.6 g/dL (ref 1.5–4.5)
Glucose: 79 mg/dL (ref 70–99)
Potassium: 4.1 mmol/L (ref 3.5–5.2)
Sodium: 143 mmol/L (ref 134–144)
Total Protein: 7.1 g/dL (ref 6.0–8.5)
eGFR: 98 mL/min/{1.73_m2} (ref >59)

## 2023-10-04 LAB — HEMOGLOBIN A1C
Est. average glucose Bld gHb Est-mCnc: 114 mg/dL
Hgb A1c MFr Bld: 5.6 % (ref 4.8–5.6)

## 2023-10-04 LAB — LIPID PANEL
Chol/HDL Ratio: 5.8 ratio — ABNORMAL HIGH (ref 0.0–5.0)
Cholesterol, Total: 196 mg/dL (ref 100–199)
HDL: 34 mg/dL — ABNORMAL LOW (ref >39)
LDL Chol Calc (NIH): 137 mg/dL — ABNORMAL HIGH (ref 0–99)
Triglycerides: 138 mg/dL (ref 0–149)
VLDL Cholesterol Cal: 25 mg/dL (ref 5–40)

## 2023-10-13 ENCOUNTER — Encounter: Payer: Self-pay | Admitting: Nurse Practitioner

## 2023-10-13 DIAGNOSIS — E66811 Other obesity due to excess calories: Secondary | ICD-10-CM | POA: Insufficient documentation

## 2023-10-13 DIAGNOSIS — Z23 Encounter for immunization: Secondary | ICD-10-CM | POA: Insufficient documentation

## 2023-10-13 DIAGNOSIS — R7303 Prediabetes: Secondary | ICD-10-CM | POA: Insufficient documentation

## 2023-10-13 NOTE — Assessment & Plan Note (Signed)
 Stable, continue healthy lifestyle modifications.

## 2023-10-13 NOTE — Assessment & Plan Note (Signed)
 Cholesterol levels are elevated.  Continue current medications.  Will check lipid panel.

## 2023-10-13 NOTE — Assessment & Plan Note (Signed)
 Covid 19 vaccine given in office observed for 15 minutes without any adverse reaction

## 2023-10-13 NOTE — Assessment & Plan Note (Signed)
 He is encouraged to strive for BMI less than 30 to decrease cardiac risk. Advised to aim for at least 150 minutes of exercise per week.

## 2024-04-04 ENCOUNTER — Ambulatory Visit: Admitting: Nurse Practitioner

## 2024-04-04 ENCOUNTER — Encounter: Payer: Self-pay | Admitting: Nurse Practitioner

## 2024-04-04 VITALS — BP 120/70 | HR 61 | Temp 98.6°F | Ht 72.0 in | Wt 226.4 lb

## 2024-04-04 DIAGNOSIS — R7303 Prediabetes: Secondary | ICD-10-CM | POA: Diagnosis not present

## 2024-04-04 DIAGNOSIS — F32A Depression, unspecified: Secondary | ICD-10-CM | POA: Diagnosis not present

## 2024-04-04 DIAGNOSIS — Z23 Encounter for immunization: Secondary | ICD-10-CM

## 2024-04-04 DIAGNOSIS — F419 Anxiety disorder, unspecified: Secondary | ICD-10-CM | POA: Diagnosis not present

## 2024-04-04 DIAGNOSIS — E7800 Pure hypercholesterolemia, unspecified: Secondary | ICD-10-CM | POA: Diagnosis not present

## 2024-04-04 DIAGNOSIS — Z6830 Body mass index (BMI) 30.0-30.9, adult: Secondary | ICD-10-CM

## 2024-04-04 DIAGNOSIS — E66811 Obesity, class 1: Secondary | ICD-10-CM

## 2024-04-04 DIAGNOSIS — E6609 Other obesity due to excess calories: Secondary | ICD-10-CM

## 2024-04-04 DIAGNOSIS — Z139 Encounter for screening, unspecified: Secondary | ICD-10-CM

## 2024-04-04 NOTE — Progress Notes (Signed)
 LILLETTE Kristeen JINNY Gladis, CMA,acting as a Neurosurgeon for Gaines Ada, FNP.,have documented all relevant documentation on the behalf of Gaines Ada, FNP,as directed by  Gaines Ada, FNP while in the presence of Gaines Ada, FNP.  Subjective:  Patient ID: Scott Clayton , male    DOB: Oct 16, 1975 , 48 y.o.   MRN: [id number]  Chief Complaint  Patient presents with   Hyperlipidemia    Patient presents today for a chol and pre dm follow up, Patient reports compliance with medication. Patient denies any chest pain, SOB, or headaches.    Knee Pain    Patient reports he has bilateral knee pain, he reports his right knee is sore to the touch. He reports he used to run a lot but has now had to stop do to the pain. He first noticed the pain 3 years ago after running. He would like to make sure he doesn't have any injury so he can run again. He reports he feels it more when working out.     HPI  He is taking the rosuvastatin  about 5 days a week. He has been exercing about 4 days a week. He feels he is gaining weight. He is doing more weight training.      Past Medical History:  Diagnosis Date   Hypercholesteremia      Family History  Problem Relation Age of Onset   Cancer Mother    Renal Disease Mother    Diabetes Father    COPD Father    Hypertension Father      Current Outpatient Medications:    rosuvastatin  (CRESTOR ) 5 MG tablet, Take 1 tablet (5 mg total) by mouth daily., Disp: 90 tablet, Rfl: 1   Allergies  Allergen Reactions   Sulfa Antibiotics      Review of Systems  Constitutional: Negative.   Respiratory: Negative.    Cardiovascular: Negative.   Musculoskeletal: Negative.   Skin: Negative.   Neurological: Negative.   Psychiatric/Behavioral: Negative.       Today's Vitals   04/04/24 1602  BP: 120/70  Pulse: 61  Temp: 98.6 F (37 C)  TempSrc: Oral  Weight: 226 lb 6.4 oz (102.7 kg)  Height: 6' (1.829 m)   Body mass index is 30.71 kg/m.  Wt Readings from Last 3  Encounters:  04/04/24 226 lb 6.4 oz (102.7 kg)  10/03/23 222 lb (100.7 kg)  05/31/23 215 lb 9.6 oz (97.8 kg)      Objective:  Physical Exam Vitals and nursing note reviewed.  Constitutional:      General: He is not in acute distress.    Appearance: Normal appearance. He is obese.  HENT:     Head: Normocephalic.  Cardiovascular:     Rate and Rhythm: Normal rate and regular rhythm.     Pulses: Normal pulses.     Heart sounds: Normal heart sounds. No murmur heard. Pulmonary:     Effort: Pulmonary effort is normal. No respiratory distress.     Breath sounds: Normal breath sounds. No wheezing.  Musculoskeletal:        General: Normal range of motion.  Skin:    General: Skin is warm and dry.     Capillary Refill: Capillary refill takes less than 2 seconds.  Neurological:     General: No focal deficit present.     Mental Status: He is alert and oriented to person, place, and time.  Psychiatric:        Mood and Affect: Mood normal.  Behavior: Behavior normal.        Thought Content: Thought content normal.        Judgment: Judgment normal.        04/04/2024    4:25 PM 05/31/2023   10:48 AM  Depression screen PHQ 2/9  Decreased Interest 1 2  Down, Depressed, Hopeless 1 2  PHQ - 2 Score 2 4  Altered sleeping 1 0  Tired, decreased energy 2 0  Change in appetite 0 2  Feeling bad or failure about yourself  1 1  Trouble concentrating 2 2  Moving slowly or fidgety/restless 1 1  Suicidal thoughts 0 0  PHQ-9 Score 9 10  Difficult doing work/chores Somewhat difficult Somewhat difficult      04/04/2024    4:25 PM 05/31/2023   10:48 AM  GAD 7 : Generalized Anxiety Score  Nervous, Anxious, on Edge 3 2  Control/stop worrying 2 1  Worry too much - different things 2 1  Trouble relaxing 1 1  Restless 0 2  Easily annoyed or irritable 2 2  Afraid - awful might happen 1 1  Total GAD 7 Score 11 10  Anxiety Difficulty Somewhat difficult Somewhat difficult    Assessment And  Plan:  Hypercholesteremia Assessment & Plan: Cholesterol levels are elevated.  Continue current medications.  Will check lipid panel.   Orders: -     Lipid panel; Future -     Lipoprotein A (LPA); Future  Prediabetes Assessment & Plan: Stable, continue healthy lifestyle modifications.   Orders: -     Hemoglobin A1c; Future  Anxiety and depression Assessment & Plan: His depression score is 9 and anxiety score is 11. This seems to have worsened since not leaving his job. Will refer to [facility].     Orders: -     Amb ref to Integrated Behavior[facility]  Need for influenza vaccination Assessment & Plan: Influenza vaccine administered Encouraged to take Tylenol as needed for fever or muscle aches.   Orders: -     Flu vaccine trivalent PF, 6mos and older(Flulaval,Afluria,Fluarix,Fluzone)  Class 1 obesity due to excess calories without serious comorbidity with body mass index (BMI) of 30.0 to 30.9 in adult Assessment & Plan: He is encouraged to strive for BMI less than 30 to decrease cardiac risk. Advised to aim for at least 150 minutes of exercise per week.    Encounter for screening -     Hepatitis B surface antibody,qualitative; Future    Return for keep same next; schedule fasting labs on Monday; schedule for [facility].    Patient was given opportunity to ask questions. Patient verbalized understanding of the plan and was able to repeat key elements of the plan. All questions were answered to their satisfaction.   Collaboration of Care: Other provider involved in patient's care AEB therapist  Patient/Guardian was advised Release of Information must be obtained prior to any record release in order to collaborate their care with an outside provider. Patient/Guardian was advised if they have not already done so to contact the registration department to sign all necessary forms in order for us  to release information regarding their care.   Consent: Patient/Guardian gives verbal  consent for treatment and assignment of benefits for services provided during this visit. Patient/Guardian expressed understanding and agreed to proceed.    LILLETTE Gaines Ada, FNP, have reviewed all documentation for this visit. The documentation on 04/04/24 for the exam, diagnosis, procedures, and orders are all accurate and complete.   IF YOU  HAVE BEEN REFERRED TO A SPECIALIST, IT MAY TAKE 1-2 WEEKS TO SCHEDULE/PROCESS THE REFERRAL. IF YOU HAVE NOT HEARD FROM US /SPECIALIST IN TWO WEEKS, PLEASE GIVE US  A CALL AT [phone].

## 2024-04-08 ENCOUNTER — Other Ambulatory Visit

## 2024-04-08 DIAGNOSIS — Z139 Encounter for screening, unspecified: Secondary | ICD-10-CM

## 2024-04-08 DIAGNOSIS — R7303 Prediabetes: Secondary | ICD-10-CM

## 2024-04-08 DIAGNOSIS — E7800 Pure hypercholesterolemia, unspecified: Secondary | ICD-10-CM

## 2024-04-09 LAB — HEMOGLOBIN A1C
Est. average glucose Bld gHb Est-mCnc: 103 mg/dL
Hgb A1c MFr Bld: 5.2 % (ref 4.8–5.6)

## 2024-04-09 LAB — LIPID PANEL
Chol/HDL Ratio: 4.8 ratio (ref 0.0–5.0)
Cholesterol, Total: 177 mg/dL (ref 100–199)
HDL: 37 mg/dL — ABNORMAL LOW (ref >39)
LDL Chol Calc (NIH): 125 mg/dL — ABNORMAL HIGH (ref 0–99)
Triglycerides: 80 mg/dL (ref 0–149)
VLDL Cholesterol Cal: 15 mg/dL (ref 5–40)

## 2024-04-09 LAB — HEPATITIS B SURFACE ANTIBODY,QUALITATIVE: Hep B Surface Ab, Qual: NONREACTIVE

## 2024-04-09 LAB — LIPOPROTEIN A (LPA): Lipoprotein (a): <8.4 nmol/L (ref <75.0)

## 2024-04-15 ENCOUNTER — Ambulatory Visit: Payer: Self-pay | Admitting: Nurse Practitioner

## 2024-04-15 NOTE — Assessment & Plan Note (Signed)
 He is encouraged to strive for BMI less than 30 to decrease cardiac risk. Advised to aim for at least 150 minutes of exercise per week.

## 2024-04-15 NOTE — Assessment & Plan Note (Signed)
 Stable, continue healthy lifestyle modifications.

## 2024-04-15 NOTE — Assessment & Plan Note (Signed)
 Cholesterol levels are elevated.  Continue current medications.  Will check lipid panel.

## 2024-04-15 NOTE — Assessment & Plan Note (Addendum)
 His depression score is 9 and anxiety score is 11. This seems to have worsened since not leaving his job. Will refer to [facility].

## 2024-04-15 NOTE — Assessment & Plan Note (Signed)
 Influenza vaccine administered Encouraged to take Tylenol as needed for fever or muscle aches.

## 2024-04-18 ENCOUNTER — Institutional Professional Consult (permissible substitution): Payer: Self-pay | Admitting: Licensed Clinical Social Worker

## 2024-06-19 DIAGNOSIS — F4322 Adjustment disorder with anxiety: Secondary | ICD-10-CM | POA: Diagnosis not present

## 2024-07-10 DIAGNOSIS — F4322 Adjustment disorder with anxiety: Secondary | ICD-10-CM | POA: Diagnosis not present

## 2024-07-30 ENCOUNTER — Encounter: Payer: Self-pay | Admitting: Nurse Practitioner

## 2024-07-30 ENCOUNTER — Ambulatory Visit: Payer: Self-pay | Admitting: Nurse Practitioner

## 2024-07-30 VITALS — BP 124/70 | HR 61 | Temp 98.7°F | Ht 72.0 in | Wt 227.0 lb

## 2024-07-30 DIAGNOSIS — F419 Anxiety disorder, unspecified: Secondary | ICD-10-CM

## 2024-07-30 DIAGNOSIS — G8929 Other chronic pain: Secondary | ICD-10-CM

## 2024-07-30 DIAGNOSIS — R7303 Prediabetes: Secondary | ICD-10-CM | POA: Diagnosis not present

## 2024-07-30 DIAGNOSIS — Z6830 Body mass index (BMI) 30.0-30.9, adult: Secondary | ICD-10-CM | POA: Diagnosis not present

## 2024-07-30 DIAGNOSIS — F32A Depression, unspecified: Secondary | ICD-10-CM

## 2024-07-30 DIAGNOSIS — R41840 Attention and concentration deficit: Secondary | ICD-10-CM | POA: Diagnosis not present

## 2024-07-30 DIAGNOSIS — E66811 Obesity, class 1: Secondary | ICD-10-CM | POA: Diagnosis not present

## 2024-07-30 DIAGNOSIS — E6609 Other obesity due to excess calories: Secondary | ICD-10-CM | POA: Diagnosis not present

## 2024-07-30 DIAGNOSIS — Z79899 Other long term (current) drug therapy: Secondary | ICD-10-CM

## 2024-07-30 DIAGNOSIS — E7800 Pure hypercholesterolemia, unspecified: Secondary | ICD-10-CM | POA: Diagnosis not present

## 2024-07-30 DIAGNOSIS — M25561 Pain in right knee: Secondary | ICD-10-CM | POA: Diagnosis not present

## 2024-07-30 DIAGNOSIS — Z0000 Encounter for general adult medical examination without abnormal findings: Secondary | ICD-10-CM

## 2024-07-30 NOTE — Progress Notes (Signed)
 Scott Clayton, CMA,acting as a neurosurgeon for Scott Ada, FNP.,have documented all relevant documentation on the behalf of Scott Ada, FNP,as directed by  Scott Ada, FNP while in the presence of Scott Ada, FNP.  Subjective:   Patient ID: Scott Clayton , male    DOB: 02-08-1976 , 48 y.o.   MRN: [id number]  Chief Complaint  Patient presents with   Annual Exam    Patient presents today for HM, Patient reports compliance with medication. Patient denies any chest pain, SOB, or headaches.    Knee Pain    Patient reports he has had right knee pain, patient reports it has been going on for years but the pain is getting worse he can not sit for longer then 30 mins.     HPI  Discussed the use of AI scribe software for clinical note transcription with the patient, who gave verbal consent to proceed.  History of Present Illness Scott Clayton is a 48 year old male who presents for an annual physical exam.  He also experiences knee pain that began during running, initially felt on the inside and now on the outside at the top of the knee. The pain is described as traveling and occurs after running, leading him to stop running. He has not yet consulted an orthopedic specialist.  He has a history of elevated cholesterol levels, with a recent improvement in HDL from 34 to 37. He is currently taking a statin.  He was referred to a behavioral health provider but has not attended an appointment. He prefers counseling over medication and recalls a conversation emphasizing medication, which he is not interested in pursuing.  No issues with urination, no family history of prostate cancer, and no swelling in his feet or ankles. He mentions two recent falls, one at [employer] and another while riding a skateboard.  He is actively seeking employment in data science, specifically in operations research, and has encountered challenges with job offers requiring relocation. He has two children, a seven-year-old son  and an eight-year-old daughter, and emphasizes the need for stability to support them through high school.  He has not been exercising regularly for the past three to four weeks but was previously active three to four times a week. His diet is not restricted, and he occasionally indulges in sweets.  Past Medical History:  Diagnosis Date   Hypercholesteremia      Family History  Problem Relation Age of Onset   Cancer Mother    Renal Disease Mother    Diabetes Father    COPD Father    Hypertension Father     Current Medications[1]   Allergies[2]   Men's preventive visit. Patient Health Questionnaire (PHQ-2) is  Flowsheet Row Office Visit from 07/30/2024 in [facility]  PHQ-2 Total Score 0  Patient is on a Regular diet. Not many fried foods. Does eat desserts. Exercising - has not been exercising over the past 3-4 weeks not as much but was 3-4 times a week. Marital status: Married. Relevant history for alcohol use is:  Social History   Substance and Sexual Activity  Alcohol Use No   Relevant history for tobacco use is: Tobacco Use History[3].   Review of Systems  HENT: Negative.    Eyes: Negative.   Respiratory: Negative.    Cardiovascular: Negative.   Gastrointestinal: Negative.   Endocrine: Negative.   Genitourinary: Negative.   Musculoskeletal: Negative.   Allergic/Immunologic: Negative.   Neurological: Negative.  Hematological: Negative.   Psychiatric/Behavioral: Negative.       Today's Vitals   07/30/24 1426  BP: 124/70  Pulse: 61  Temp: 98.7 F (37.1 C)  TempSrc: Oral  Weight: 227 lb (103 kg)  Height: 6' (1.829 m)  PainSc: 0-No pain   Body mass index is 30.79 kg/m.  Wt Readings from Last 3 Encounters:  07/30/24 227 lb (103 kg)  04/04/24 226 lb 6.4 oz (102.7 kg)  10/03/23 222 lb (100.7 kg)    Objective:  Physical Exam Vitals and nursing note reviewed.  Constitutional:      General: He is not in acute  distress.    Appearance: Normal appearance. He is obese.  HENT:     Head: Normocephalic and atraumatic.     Right Ear: Tympanic membrane, ear canal and external ear normal. There is no impacted cerumen.     Left Ear: Tympanic membrane, ear canal and external ear normal. There is no impacted cerumen.     Nose: Nose normal.     Mouth/Throat:     Mouth: Mucous membranes are moist.  Eyes:     Pupils: Pupils are equal, round, and reactive to light.  Cardiovascular:     Rate and Rhythm: Normal rate and regular rhythm.     Pulses: Normal pulses.     Heart sounds: Normal heart sounds. No murmur heard. Pulmonary:     Effort: Pulmonary effort is normal. No respiratory distress.     Breath sounds: Normal breath sounds. No wheezing.  Abdominal:     General: Abdomen is flat. Bowel sounds are normal. There is no distension.     Palpations: Abdomen is soft.     Tenderness: There is no abdominal tenderness.  Genitourinary:    Comments: Deferred - will add PSA Musculoskeletal:        General: Tenderness (right medial knee on palpation) present. Normal range of motion.     Cervical back: Normal range of motion and neck supple.     Right knee: Tenderness present over the medial joint line.     Instability Tests: Anterior drawer test negative. Posterior drawer test negative.     Left knee: Normal.     Instability Tests: Anterior drawer test negative. Posterior drawer test negative.  Skin:    General: Skin is warm and dry.     Capillary Refill: Capillary refill takes less than 2 seconds.  Neurological:     General: No focal deficit present.     Mental Status: He is alert and oriented to person, place, and time.     Cranial Nerves: No cranial nerve deficit.     Motor: No weakness.  Psychiatric:        Mood and Affect: Mood normal.        Behavior: Behavior normal.        Thought Content: Thought content normal.        Judgment: Judgment normal.         Assessment And Plan:    Encounter  for annual health examination Assessment & Plan: Behavior modifications discussed and diet history reviewed.   Pt will continue to exercise regularly and modify diet with low GI, plant based foods and decrease intake of processed foods.  Recommend intake of daily multivitamin, Vitamin D, and calcium .  Recommend colonoscopy for preventive screenings, as well as recommend immunizations that include influenza, TDAP    Anxiety and depression Assessment & Plan: Prefers counseling over medication; previous referral not followed up due to  medication concerns. - Offered referral to a counselor outside current behavioral health provider if preferred.   Prediabetes Assessment & Plan: Stable, continue healthy lifestyle modifications.    Hypercholesteremia Assessment & Plan: Cholesterol levels slightly elevated; HDL improved to 37. LDL remains a cardiovascular concern. - Rechecked cholesterol levels today. - Encouraged consumption of good fats: avocado, nuts, olives, fish thrice weekly. - Recommended exercise to boost HDL. - Suggested fish oil supplementation if not consuming fish regularly.  Orders: -     CMP14+EGFR -     Lipid panel  Concentration deficit  Chronic pain of right knee Assessment & Plan: Pain shifted to outside top of knee, possibly tendinitis; exacerbated by running. - Referred to orthopedic specialist for evaluation and possible imaging.  Orders: -     Ambulatory referral to Orthopedics  Class 1 obesity due to excess calories without serious comorbidity with body mass index (BMI) of 30.0 to 30.9 in adult Assessment & Plan: He is encouraged to strive for BMI less than 30 to decrease cardiac risk. Advised to aim for at least 150 minutes of exercise per week as tolerated.   Other long term (current) drug therapy -     CBC with Differential/Platelet     Return for 1 year physical, 6 month chol check. Patient was given opportunity to ask questions. Patient  verbalized understanding of the plan and was able to repeat key elements of the plan. All questions were answered to their satisfaction.   Scott Ada, FNP I, Scott Ada, FNP, have reviewed all documentation for this visit. The documentation on 07/30/2024 for the exam, diagnosis, procedures, and orders are all accurate and complete.       [1]  Current Outpatient Medications:    rosuvastatin  (CRESTOR ) 5 MG tablet, Take 1 tablet (5 mg total) by mouth daily., Disp: 90 tablet, Rfl: 1 [2]  Allergies Allergen Reactions   Sulfa Antibiotics   [3]  Social History Tobacco Use  Smoking Status Never  Smokeless Tobacco Never

## 2024-08-05 ENCOUNTER — Other Ambulatory Visit: Payer: Self-pay

## 2024-08-05 DIAGNOSIS — Z125 Encounter for screening for malignant neoplasm of prostate: Secondary | ICD-10-CM

## 2024-08-05 DIAGNOSIS — G8929 Other chronic pain: Secondary | ICD-10-CM | POA: Insufficient documentation

## 2024-08-05 NOTE — Assessment & Plan Note (Signed)
 He is encouraged to strive for BMI less than 30 to decrease cardiac risk. Advised to aim for at least 150 minutes of exercise per week as tolerated

## 2024-08-05 NOTE — Assessment & Plan Note (Signed)
 Behavior modifications discussed and diet history reviewed.   Pt will continue to exercise regularly and modify diet with low GI, plant based foods and decrease intake of processed foods.  Recommend intake of daily multivitamin, Vitamin D, and calcium.  Recommend colonoscopy for preventive screenings, as well as recommend immunizations that include influenza, TDAP

## 2024-08-05 NOTE — Assessment & Plan Note (Signed)
 Stable, continue healthy lifestyle modifications.

## 2024-08-05 NOTE — Assessment & Plan Note (Signed)
 Prefers counseling over medication; previous referral not followed up due to medication concerns. - Offered referral to a counselor outside current behavioral health provider if preferred.

## 2024-08-05 NOTE — Assessment & Plan Note (Signed)
 Cholesterol levels slightly elevated; HDL improved to 37. LDL remains a cardiovascular concern. - Rechecked cholesterol levels today. - Encouraged consumption of good fats: avocado, nuts, olives, fish thrice weekly. - Recommended exercise to boost HDL. - Suggested fish oil supplementation if not consuming fish regularly.

## 2024-08-05 NOTE — Assessment & Plan Note (Signed)
 Pain shifted to outside top of knee, possibly tendinitis; exacerbated by running. - Referred to orthopedic specialist for evaluation and possible imaging.

## 2024-08-07 ENCOUNTER — Other Ambulatory Visit: Payer: Self-pay

## 2024-08-07 DIAGNOSIS — Z125 Encounter for screening for malignant neoplasm of prostate: Secondary | ICD-10-CM

## 2024-08-07 LAB — PSA: Prostate Specific Ag, Serum: 0.5 ng/mL (ref 0.0–4.0)

## 2024-08-10 LAB — COMPREHENSIVE METABOLIC PANEL WITH GFR
ALT: 33 IU/L (ref 0–44)
AST: 20 IU/L (ref 0–40)
Albumin: 4.3 g/dL (ref 4.1–5.1)
Alkaline Phosphatase: 76 IU/L (ref 47–123)
BUN/Creatinine Ratio: 17 (ref 9–20)
BUN: 17 mg/dL (ref 6–24)
Bilirubin Total: 0.4 mg/dL (ref 0.0–1.2)
CO2: 24 mmol/L (ref 20–29)
Calcium: 9.3 mg/dL (ref 8.7–10.2)
Chloride: 99 mmol/L (ref 96–106)
Creatinine, Ser: 1.03 mg/dL (ref 0.76–1.27)
Globulin, Total: 2.3 g/dL (ref 1.5–4.5)
Glucose: 92 mg/dL (ref 70–99)
Potassium: 4.3 mmol/L (ref 3.5–5.2)
Sodium: 140 mmol/L (ref 134–144)
Total Protein: 6.6 g/dL (ref 6.0–8.5)
eGFR: 90 mL/min/1.73

## 2024-08-10 LAB — SPECIMEN STATUS REPORT

## 2024-08-10 LAB — LIPID PANEL W/O CHOL/HDL RATIO
Cholesterol, Total: 229 mg/dL — ABNORMAL HIGH (ref 100–199)
HDL: 32 mg/dL — ABNORMAL LOW
LDL Chol Calc (NIH): 155 mg/dL — ABNORMAL HIGH (ref 0–99)
Triglycerides: 229 mg/dL — ABNORMAL HIGH (ref 0–149)
VLDL Cholesterol Cal: 42 mg/dL — ABNORMAL HIGH (ref 5–40)

## 2024-08-13 ENCOUNTER — Ambulatory Visit: Payer: Self-pay | Admitting: Nurse Practitioner

## 2025-01-28 ENCOUNTER — Ambulatory Visit: Payer: Self-pay | Admitting: Nurse Practitioner

## 2025-07-31 ENCOUNTER — Encounter: Payer: Self-pay | Admitting: Nurse Practitioner
